# Patient Record
Sex: Male | Born: 1975 | Race: White | Hispanic: Yes | Marital: Single | Smoking: Never smoker
Health system: Southern US, Community
[De-identification: ages and names within clinical notes are randomized; demographics above are authoritative.]

---

## 2019-01-25 ENCOUNTER — Other Ambulatory Visit: Payer: Self-pay

## 2019-01-25 ENCOUNTER — Emergency Department (HOSPITAL_COMMUNITY)
Admission: EM | Admit: 2019-01-25 | Discharge: 2019-01-25 | Disposition: A | Discharge status: Home / Self Care | Payer: BC Managed Care – PPO | Attending: Emergency Medicine | Admitting: Emergency Medicine

## 2019-01-25 ENCOUNTER — Emergency Department (HOSPITAL_COMMUNITY): Payer: BC Managed Care – PPO

## 2019-01-25 ENCOUNTER — Encounter (HOSPITAL_COMMUNITY): Payer: Self-pay | Admitting: Emergency Medicine

## 2019-01-25 DIAGNOSIS — W208XXA Other cause of strike by thrown, projected or falling object, initial encounter: Secondary | ICD-10-CM | POA: Insufficient documentation

## 2019-01-25 DIAGNOSIS — S0012XA Contusion of left eyelid and periocular area, initial encounter: Secondary | ICD-10-CM | POA: Diagnosis not present

## 2019-01-25 DIAGNOSIS — Y929 Unspecified place or not applicable: Secondary | ICD-10-CM | POA: Diagnosis not present

## 2019-01-25 DIAGNOSIS — S0512XA Contusion of eyeball and orbital tissues, left eye, initial encounter: Secondary | ICD-10-CM

## 2019-01-25 DIAGNOSIS — Y93H2 Activity, gardening and landscaping: Secondary | ICD-10-CM | POA: Diagnosis not present

## 2019-01-25 DIAGNOSIS — S098XXA Other specified injuries of head, initial encounter: Secondary | ICD-10-CM | POA: Diagnosis not present

## 2019-01-25 DIAGNOSIS — Y998 Other external cause status: Secondary | ICD-10-CM | POA: Insufficient documentation

## 2019-01-25 DIAGNOSIS — R600 Localized edema: Secondary | ICD-10-CM | POA: Diagnosis present

## 2019-01-25 NOTE — ED Notes (Signed)
OD  20/20 OS 20/30  OU 20/20  L eye bruised and swollen

## 2019-01-25 NOTE — ED Triage Notes (Signed)
Patient states that he was working in the yard and was hit in the face by a limb approximately 3 inches in diameter. Patient states left eye started swelling. States some blurry vision in left eye.  Left eye is swollen shut in triage.

## 2019-01-25 NOTE — ED Provider Notes (Signed)
[facility] EMERGENCY DEPARTMENT Provider Note   CSN: [id number] Arrival date & time: 01/25/19  1552     History   Chief Complaint Chief Complaint  Patient presents with   Facial Swelling    HPI Mitchell Powers is a 43 y.o. male.     HPI   Mitchell Powers is a 43 y.o. male who presents to the Emergency Department requesting evaluation for swelling and pain to his left periorbital area.  He states that he was trimming a tree when a tree branch approximately 3 to 4 cm in diameter struck him along the left eyebrow area.  Incident occurred around 11 AM today.  Initially he reports mild swelling around his left eye that gradually continued throughout the day.  He states he has difficulty opening his left eye due to swelling of his lids, but states that his vision seems "normal."  He denies fall or LOC.  He does endorse a mild headache.  He took ibuprofen prior to arrival which helped his symptoms.  He denies lethargy, vomiting, neck pain, jaw or ear pain, epistaxis.  No open wounds.    History reviewed. No pertinent past medical history.  There are no active problems to display for this patient.   History reviewed. No pertinent surgical history.      Home Medications    Prior to Admission medications   Not on File    Family History No family history on file.  Social History Social History   Tobacco Use   Smoking status: Never Smoker   Smokeless tobacco: Never Used  Substance Use Topics   Alcohol use: Yes    Comment: occ   Drug use: Never     Allergies   Patient has no known allergies.   Review of Systems Review of Systems  Constitutional: Negative for activity change, appetite change and fever.  HENT: Positive for facial swelling (Left periorbital pain and swelling). Negative for dental problem, ear pain, nosebleeds, sore throat and trouble swallowing.   Eyes: Negative for photophobia, pain, discharge, redness and visual disturbance.  Respiratory:  Negative for shortness of breath.   Cardiovascular: Negative for chest pain.  Gastrointestinal: Negative for abdominal pain, nausea and vomiting.  Musculoskeletal: Negative for neck pain and neck stiffness.  Skin: Negative for rash and wound.  Neurological: Positive for headaches. Negative for dizziness, syncope, facial asymmetry, speech difficulty, weakness and numbness.  Psychiatric/Behavioral: Negative for confusion and decreased concentration.     Physical Exam Updated Vital Signs BP 137/72 (BP Location: Right Arm)    Pulse (!) 59    Temp 98.5 F (36.9 C) (Oral)    Resp 16    Ht 5\' 4"  (1.626 m)    Wt 79.4 kg    SpO2 100%    BMI 30.04 kg/m   Physical Exam Vitals signs and nursing note reviewed.  Constitutional:      Appearance: He is not ill-appearing or toxic-appearing.  HENT:     Head: Contusion present. No abrasion.     Jaw: There is normal jaw occlusion. No tenderness, swelling, pain on movement or malocclusion.      Comments: Ecchymosis and edema of the left periorbital region.  Hematoma present along the left eyebrow.  No open wound.  No tenderness of the left orbital floor.    Right Ear: Tympanic membrane and ear canal normal. No mastoid tenderness. No hemotympanum.     Left Ear: Tympanic membrane and ear canal normal. No mastoid tenderness. No hemotympanum.  Nose: Nose normal. No nasal tenderness or rhinorrhea.     Right Nostril: No epistaxis.     Left Nostril: No epistaxis.     Mouth/Throat:     Mouth: Mucous membranes are moist.     Dentition: No dental tenderness.     Pharynx: Oropharynx is clear.  Eyes:     General: Vision grossly intact. Gaze aligned appropriately.     Extraocular Movements: Extraocular movements intact.     Conjunctiva/sclera: Conjunctivae normal.     Left eye: Left conjunctiva is not injected. No hemorrhage.    Pupils: Pupils are equal, round, and reactive to light.     Comments: No FB's seen but exam limited due to unability to evert the  upper lid due to edema.  EOMs intact.  Sclera normal appearing.  Neck:     Musculoskeletal: Normal range of motion. No muscular tenderness.  Cardiovascular:     Rate and Rhythm: Normal rate and regular rhythm.     Pulses: Normal pulses.  Pulmonary:     Effort: Pulmonary effort is normal.     Breath sounds: Normal breath sounds.  Musculoskeletal: Normal range of motion.  Skin:    General: Skin is warm.     Capillary Refill: Capillary refill takes less than 2 seconds.  Neurological:     General: No focal deficit present.     Mental Status: He is alert.     Sensory: Sensation is intact. No sensory deficit.     Motor: Motor function is intact. No weakness.     Comments: CN II-XII intact.  Speech clear.        ED Treatments / Results  Labs (all labs ordered are listed, but only abnormal results are displayed) Labs Reviewed - No data to display  EKG None  Radiology Ct Head Wo Contrast  Result Date: 01/25/2019 CLINICAL DATA:  43 year old male with facial trauma and right eye swelling. EXAM: CT HEAD WITHOUT CONTRAST CT MAXILLOFACIAL WITHOUT CONTRAST CT CERVICAL SPINE WITHOUT CONTRAST TECHNIQUE: Multidetector CT imaging of the head, cervical spine, and maxillofacial structures were performed using the standard protocol without intravenous contrast. Multiplanar CT image reconstructions of the cervical spine and maxillofacial structures were also generated. COMPARISON:  None. FINDINGS: CT HEAD FINDINGS Brain: No evidence of acute infarction, hemorrhage, hydrocephalus, extra-axial collection or mass lesion/mass effect. Vascular: No hyperdense vessel or unexpected calcification. Skull: Normal. Negative for fracture or focal lesion. Other: Left forehead and periorbital hematoma. CT MAXILLOFACIAL FINDINGS Osseous: No fracture or mandibular dislocation. No destructive process. Orbits: The globes and retro-orbital fat are preserved. Sinuses: Clear. Soft tissues: Left facial and periorbital  hematoma. CT CERVICAL SPINE FINDINGS Alignment: No acute subluxation. There is mild reversal of normal cervical lordosis which may be positional or due to muscle spasm. Skull base and vertebrae: No acute fracture. No primary bone lesion or focal pathologic process. Soft tissues and spinal canal: No prevertebral fluid or swelling. No visible canal hematoma. Disc levels:  Mild degenerative changes. Upper chest: Negative. Other: None IMPRESSION: 1. Normal unenhanced CT of the brain. 2. No acute/traumatic cervical spine pathology. 3. No acute/traumatic facial bone fractures. Electronically Signed   By: Anner Crete M.D.   On: 01/25/2019 20:33   Ct Cervical Spine Wo Contrast  Result Date: 01/25/2019 CLINICAL DATA:  43 year old male with facial trauma and right eye swelling. EXAM: CT HEAD WITHOUT CONTRAST CT MAXILLOFACIAL WITHOUT CONTRAST CT CERVICAL SPINE WITHOUT CONTRAST TECHNIQUE: Multidetector CT imaging of the head, cervical spine, and maxillofacial structures were  performed using the standard protocol without intravenous contrast. Multiplanar CT image reconstructions of the cervical spine and maxillofacial structures were also generated. COMPARISON:  None. FINDINGS: CT HEAD FINDINGS Brain: No evidence of acute infarction, hemorrhage, hydrocephalus, extra-axial collection or mass lesion/mass effect. Vascular: No hyperdense vessel or unexpected calcification. Skull: Normal. Negative for fracture or focal lesion. Other: Left forehead and periorbital hematoma. CT MAXILLOFACIAL FINDINGS Osseous: No fracture or mandibular dislocation. No destructive process. Orbits: The globes and retro-orbital fat are preserved. Sinuses: Clear. Soft tissues: Left facial and periorbital hematoma. CT CERVICAL SPINE FINDINGS Alignment: No acute subluxation. There is mild reversal of normal cervical lordosis which may be positional or due to muscle spasm. Skull base and vertebrae: No acute fracture. No primary bone lesion or focal  pathologic process. Soft tissues and spinal canal: No prevertebral fluid or swelling. No visible canal hematoma. Disc levels:  Mild degenerative changes. Upper chest: Negative. Other: None IMPRESSION: 1. Normal unenhanced CT of the brain. 2. No acute/traumatic cervical spine pathology. 3. No acute/traumatic facial bone fractures. Electronically Signed   By: Elgie CollardArash  Radparvar M.D.   On: 01/25/2019 20:33   Ct Maxillofacial Wo Contrast  Result Date: 01/25/2019 CLINICAL DATA:  43 year old male with facial trauma and right eye swelling. EXAM: CT HEAD WITHOUT CONTRAST CT MAXILLOFACIAL WITHOUT CONTRAST CT CERVICAL SPINE WITHOUT CONTRAST TECHNIQUE: Multidetector CT imaging of the head, cervical spine, and maxillofacial structures were performed using the standard protocol without intravenous contrast. Multiplanar CT image reconstructions of the cervical spine and maxillofacial structures were also generated. COMPARISON:  None. FINDINGS: CT HEAD FINDINGS Brain: No evidence of acute infarction, hemorrhage, hydrocephalus, extra-axial collection or mass lesion/mass effect. Vascular: No hyperdense vessel or unexpected calcification. Skull: Normal. Negative for fracture or focal lesion. Other: Left forehead and periorbital hematoma. CT MAXILLOFACIAL FINDINGS Osseous: No fracture or mandibular dislocation. No destructive process. Orbits: The globes and retro-orbital fat are preserved. Sinuses: Clear. Soft tissues: Left facial and periorbital hematoma. CT CERVICAL SPINE FINDINGS Alignment: No acute subluxation. There is mild reversal of normal cervical lordosis which may be positional or due to muscle spasm. Skull base and vertebrae: No acute fracture. No primary bone lesion or focal pathologic process. Soft tissues and spinal canal: No prevertebral fluid or swelling. No visible canal hematoma. Disc levels:  Mild degenerative changes. Upper chest: Negative. Other: None IMPRESSION: 1. Normal unenhanced CT of the brain. 2. No  acute/traumatic cervical spine pathology. 3. No acute/traumatic facial bone fractures. Electronically Signed   By: Elgie CollardArash  Radparvar M.D.   On: 01/25/2019 20:33    Procedures Procedures (including critical care time)  Medications Ordered in ED Medications - No data to display   Initial Impression / Assessment and Plan / ED Course  I have reviewed the triage vital signs and the nursing notes.  Pertinent labs & imaging results that were available during my care of the patient were reviewed by me and considered in my medical decision making (see chart for details).          Visual Acuity  Right Eye Distance:  20/20 Left Eye Distance:  20/30 Bilateral Distance:  20/20  Patient with blunt trauma of the left periorbital region.  No visual deficits on exam.  Left globe intact.  CT scan of the head, C-spine, and maxillofacial region are negative for acute fracture and globe intact.  Patient is well-appearing, no focal neuro deficits.  No history of syncope.  He does have a significant hematoma of the left eyebrow region.  Moderate edema  noted to the left upper eyelid that limits exam of the left eye.  He agrees to continue to apply ice, ibuprofen for pain control and strict return precautions were discussed.  He verbalized understanding and agreed to plan.  He appears appropriate for discharge home.    Final Clinical Impressions(s) / ED Diagnoses   Final diagnoses:  Periorbital contusion of left eye, initial encounter    ED Discharge Orders    None       Pauline Aus, PA-C 01/25/19 2121    Vanetta Mulders, MD 02/04/19 [phone]

## 2019-01-25 NOTE — Discharge Instructions (Signed)
Continue to apply ice packs on and off to your left eye.  You may also use a cool washcloth.  Take ibuprofen 600 to 800 mg with food every every 8 hours as needed for pain.  Return to the emergency room if you develop any worsening symptoms such as sudden onset of severe headache, vomiting, sudden visual changes or confusion

## 2019-01-25 NOTE — ED Notes (Signed)
Hit in head with a branch  Denies LOC  Now with swollen and bruised L eye

## 2019-03-26 ENCOUNTER — Other Ambulatory Visit: Payer: Self-pay

## 2019-03-26 ENCOUNTER — Ambulatory Visit: Payer: BC Managed Care – PPO | Attending: Internal Medicine

## 2019-03-26 DIAGNOSIS — Z20822 Contact with and (suspected) exposure to covid-19: Secondary | ICD-10-CM

## 2019-03-27 ENCOUNTER — Telehealth: Payer: Self-pay

## 2019-03-27 LAB — NOVEL CORONAVIRUS, NAA: SARS-CoV-2, NAA: DETECTED — AB

## 2019-03-27 NOTE — Telephone Encounter (Signed)
Checking on COVID 19 results, not available yet. 

## 2019-09-16 DIAGNOSIS — J019 Acute sinusitis, unspecified: Secondary | ICD-10-CM | POA: Diagnosis not present

## 2019-09-16 DIAGNOSIS — R5382 Chronic fatigue, unspecified: Secondary | ICD-10-CM | POA: Diagnosis not present

## 2019-09-16 DIAGNOSIS — J309 Allergic rhinitis, unspecified: Secondary | ICD-10-CM | POA: Diagnosis not present

## 2019-09-17 DIAGNOSIS — R5383 Other fatigue: Secondary | ICD-10-CM | POA: Diagnosis not present

## 2019-09-17 DIAGNOSIS — I110 Hypertensive heart disease with heart failure: Secondary | ICD-10-CM | POA: Diagnosis not present

## 2019-09-17 DIAGNOSIS — E559 Vitamin D deficiency, unspecified: Secondary | ICD-10-CM | POA: Diagnosis not present

## 2019-09-17 DIAGNOSIS — E7849 Other hyperlipidemia: Secondary | ICD-10-CM | POA: Diagnosis not present

## 2019-09-30 DIAGNOSIS — M545 Low back pain: Secondary | ICD-10-CM | POA: Diagnosis not present

## 2019-09-30 DIAGNOSIS — M2550 Pain in unspecified joint: Secondary | ICD-10-CM | POA: Diagnosis not present

## 2019-10-10 ENCOUNTER — Other Ambulatory Visit (HOSPITAL_COMMUNITY): Payer: Self-pay | Admitting: Family Medicine

## 2019-10-10 ENCOUNTER — Other Ambulatory Visit: Payer: Self-pay

## 2019-10-10 ENCOUNTER — Ambulatory Visit (HOSPITAL_COMMUNITY)
Admission: RE | Admit: 2019-10-10 | Discharge: 2019-10-10 | Disposition: A | Discharge status: Home / Self Care | Payer: BC Managed Care – PPO | Source: Ambulatory Visit | Attending: Family Medicine | Admitting: Family Medicine

## 2019-10-10 DIAGNOSIS — M545 Low back pain, unspecified: Secondary | ICD-10-CM

## 2019-11-08 ENCOUNTER — Ambulatory Visit: Payer: BC Managed Care – PPO

## 2019-12-22 DIAGNOSIS — J309 Allergic rhinitis, unspecified: Secondary | ICD-10-CM | POA: Diagnosis not present

## 2019-12-22 DIAGNOSIS — M545 Low back pain: Secondary | ICD-10-CM | POA: Diagnosis not present

## 2019-12-22 DIAGNOSIS — M2550 Pain in unspecified joint: Secondary | ICD-10-CM | POA: Diagnosis not present

## 2019-12-22 DIAGNOSIS — E7849 Other hyperlipidemia: Secondary | ICD-10-CM | POA: Diagnosis not present

## 2019-12-26 DIAGNOSIS — E7849 Other hyperlipidemia: Secondary | ICD-10-CM | POA: Diagnosis not present

## 2020-02-27 DIAGNOSIS — M2550 Pain in unspecified joint: Secondary | ICD-10-CM | POA: Diagnosis not present

## 2020-02-27 DIAGNOSIS — J309 Allergic rhinitis, unspecified: Secondary | ICD-10-CM | POA: Diagnosis not present

## 2020-02-27 DIAGNOSIS — E7849 Other hyperlipidemia: Secondary | ICD-10-CM | POA: Diagnosis not present

## 2020-08-31 DIAGNOSIS — J309 Allergic rhinitis, unspecified: Secondary | ICD-10-CM | POA: Diagnosis not present

## 2020-08-31 DIAGNOSIS — E7849 Other hyperlipidemia: Secondary | ICD-10-CM | POA: Diagnosis not present

## 2020-08-31 DIAGNOSIS — R5382 Chronic fatigue, unspecified: Secondary | ICD-10-CM | POA: Diagnosis not present

## 2020-09-23 IMAGING — DX DG LUMBAR SPINE COMPLETE 4+V
5 series · 5 of 5 positions shown · non-contrast
Comparison: No prior.

CLINICAL DATA: Low back pain.

EXAM:
LUMBAR SPINE - COMPLETE 4+ VIEW

[l-spine ap]
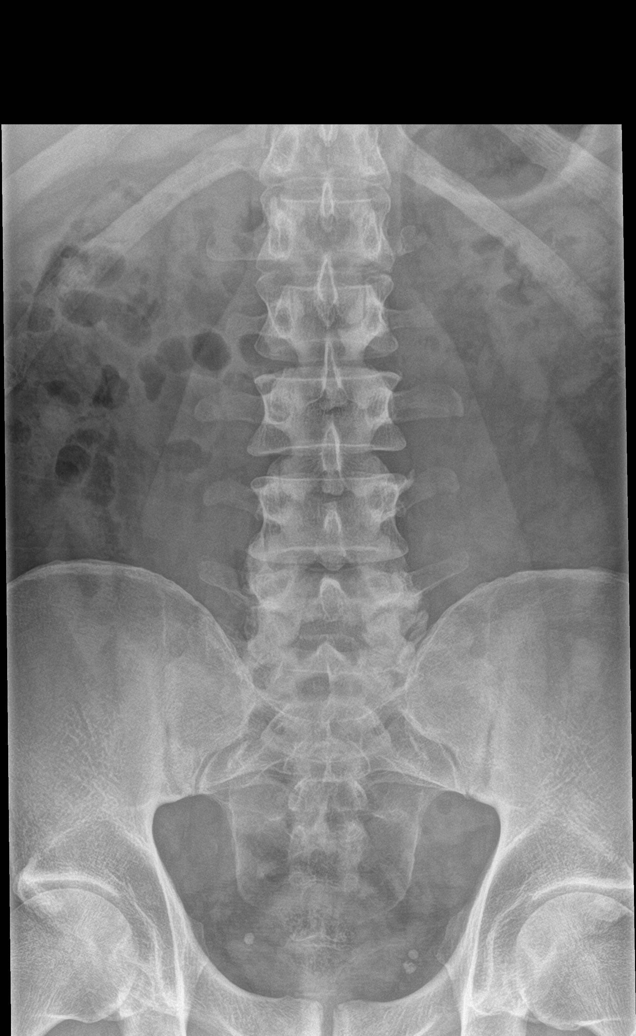

[l-spine obl (1 of 2)]
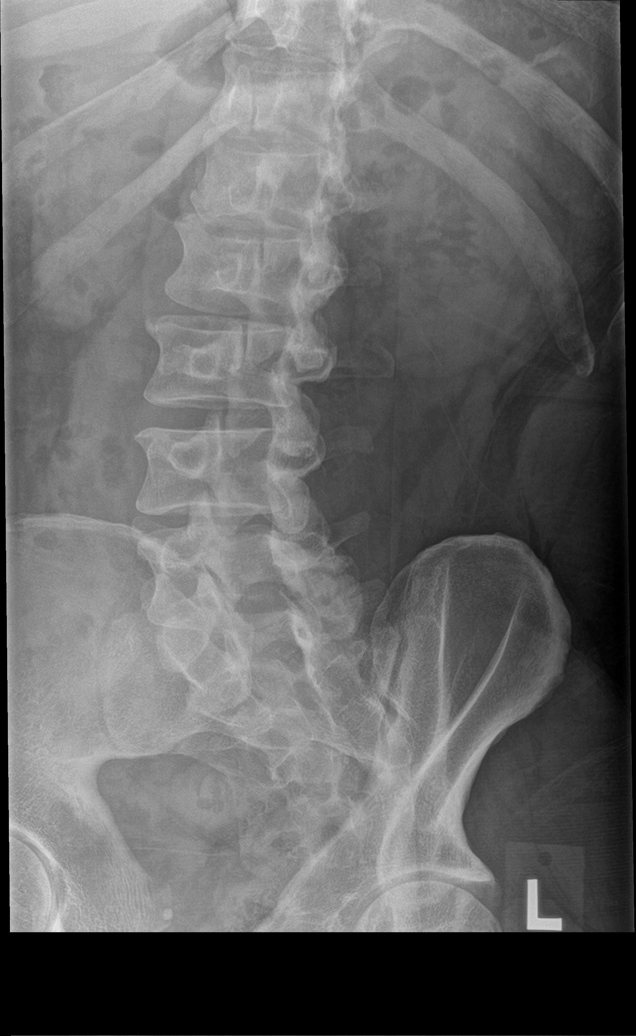

[l-spine obl (2 of 2)]
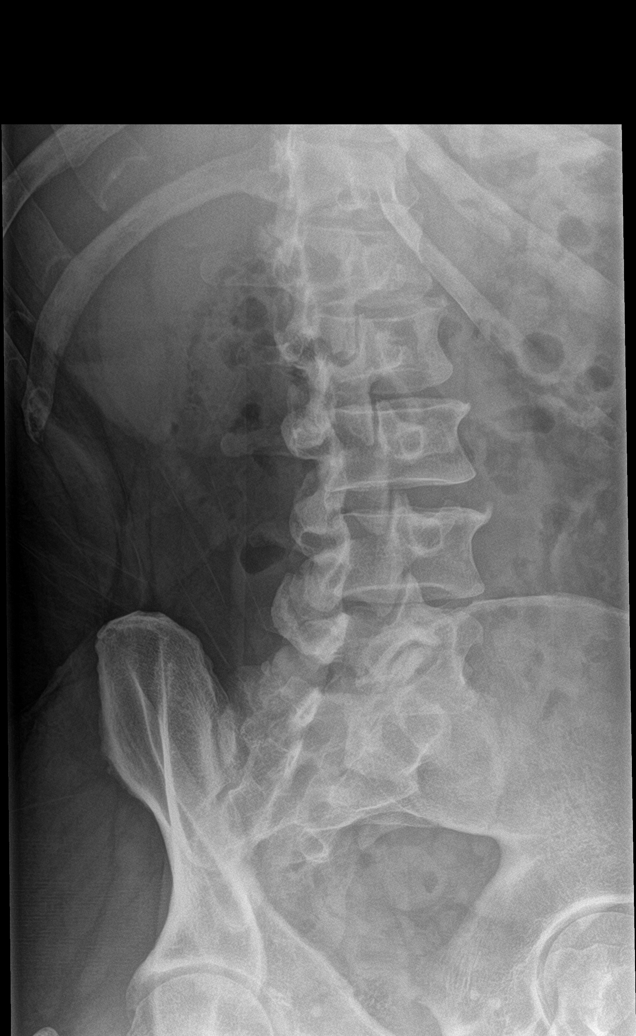

[l-spine lat]
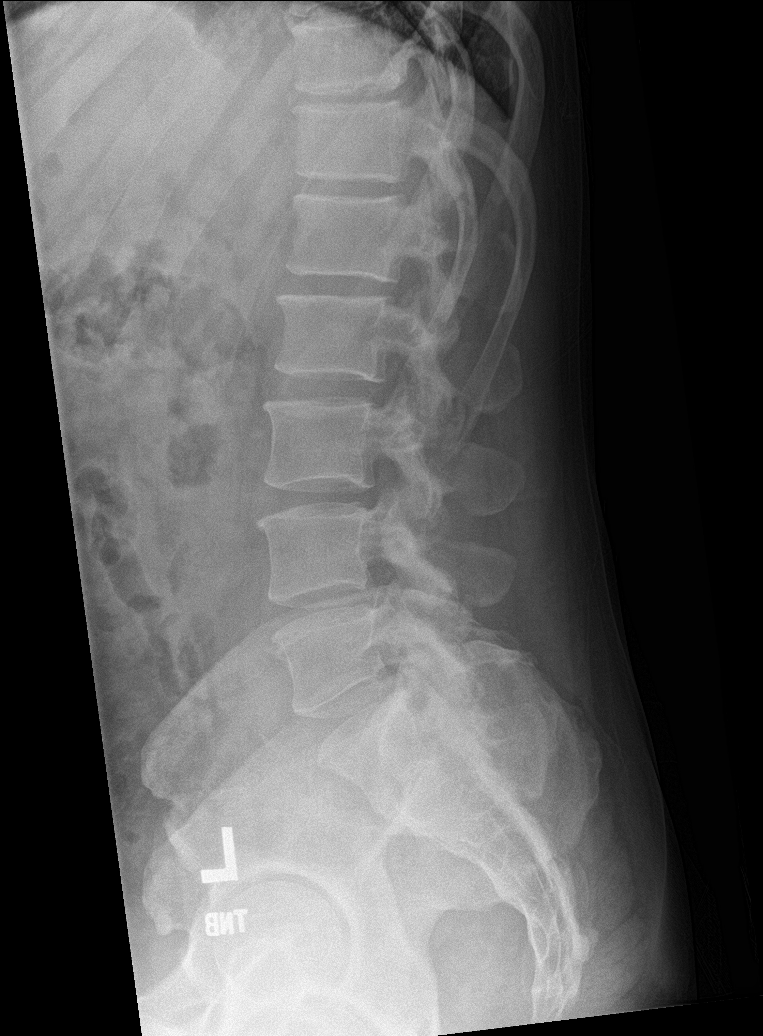

[l-spine spot]
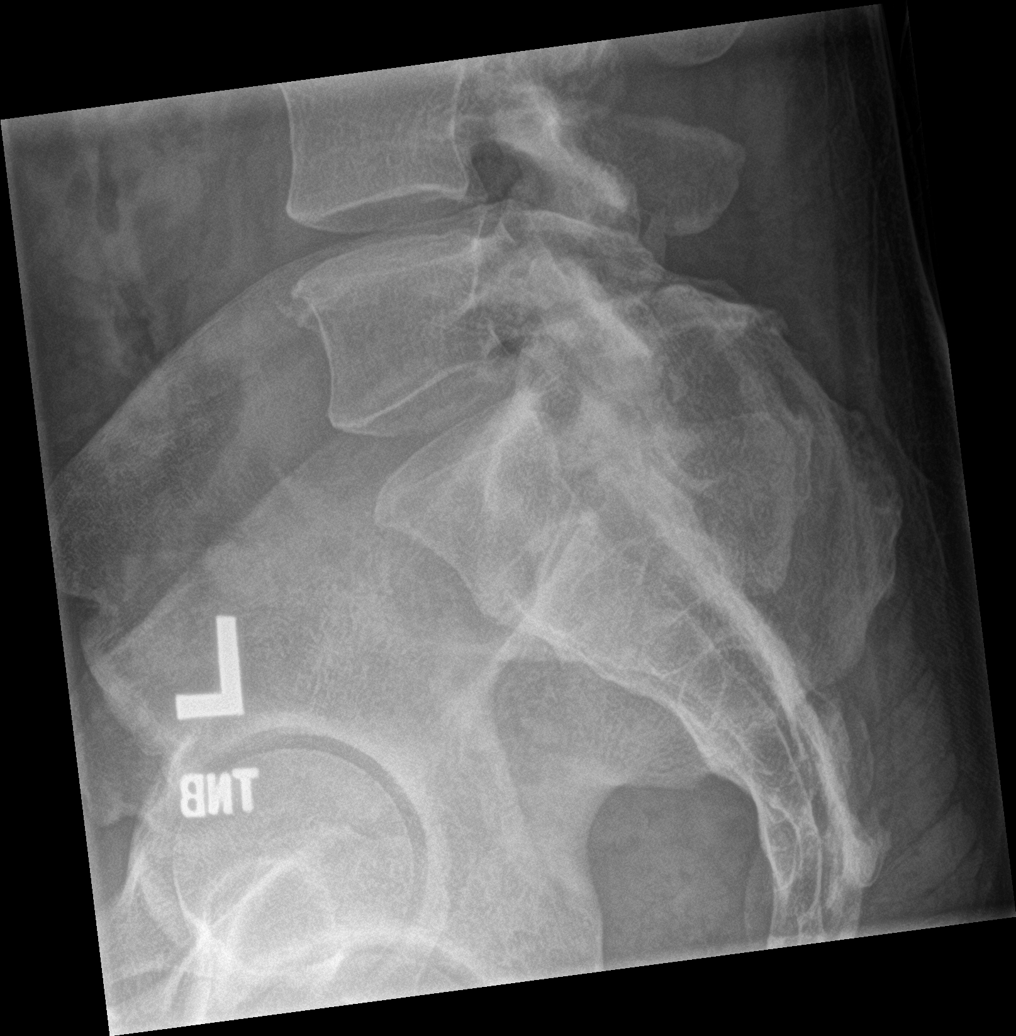

[5 of 5 positions shown; findings below may reference images not displayed]

FINDINGS: Paraspinal soft tissues are unremarkable. Diffuse multilevel
degenerative change. No acute bony abnormality identified. No
evidence of fracture.
IMPRESSION: Diffuse multilevel degenerative change. No acute abnormality
identified.

## 2021-04-26 DIAGNOSIS — L818 Other specified disorders of pigmentation: Secondary | ICD-10-CM | POA: Diagnosis not present

## 2021-04-27 DIAGNOSIS — N4889 Other specified disorders of penis: Secondary | ICD-10-CM | POA: Diagnosis not present

## 2021-04-27 DIAGNOSIS — R309 Painful micturition, unspecified: Secondary | ICD-10-CM | POA: Diagnosis not present

## 2021-04-27 DIAGNOSIS — L818 Other specified disorders of pigmentation: Secondary | ICD-10-CM | POA: Diagnosis not present

## 2021-05-12 DIAGNOSIS — R300 Dysuria: Secondary | ICD-10-CM | POA: Diagnosis not present

## 2021-05-12 DIAGNOSIS — Z113 Encounter for screening for infections with a predominantly sexual mode of transmission: Secondary | ICD-10-CM | POA: Diagnosis not present

## 2021-05-12 DIAGNOSIS — Z7689 Persons encountering health services in other specified circumstances: Secondary | ICD-10-CM | POA: Diagnosis not present

## 2021-05-12 DIAGNOSIS — Z6831 Body mass index (BMI) 31.0-31.9, adult: Secondary | ICD-10-CM | POA: Diagnosis not present

## 2021-05-12 DIAGNOSIS — N489 Disorder of penis, unspecified: Secondary | ICD-10-CM | POA: Diagnosis not present

## 2021-09-20 DIAGNOSIS — Z6830 Body mass index (BMI) 30.0-30.9, adult: Secondary | ICD-10-CM | POA: Diagnosis not present

## 2021-09-20 DIAGNOSIS — K649 Unspecified hemorrhoids: Secondary | ICD-10-CM | POA: Diagnosis not present

## 2021-10-14 DIAGNOSIS — K649 Unspecified hemorrhoids: Secondary | ICD-10-CM | POA: Diagnosis not present

## 2021-10-14 DIAGNOSIS — K625 Hemorrhage of anus and rectum: Secondary | ICD-10-CM | POA: Diagnosis not present

## 2022-01-27 DIAGNOSIS — I10 Essential (primary) hypertension: Secondary | ICD-10-CM | POA: Diagnosis not present

## 2022-01-27 DIAGNOSIS — Z6830 Body mass index (BMI) 30.0-30.9, adult: Secondary | ICD-10-CM | POA: Diagnosis not present

## 2022-01-27 DIAGNOSIS — N489 Disorder of penis, unspecified: Secondary | ICD-10-CM | POA: Diagnosis not present

## 2022-02-10 ENCOUNTER — Ambulatory Visit (INDEPENDENT_AMBULATORY_CARE_PROVIDER_SITE_OTHER): Payer: BC Managed Care – PPO | Admitting: Urology

## 2022-02-10 VITALS — BP 136/83 | HR 62 | Ht 64.0 in | Wt 181.0 lb

## 2022-02-10 DIAGNOSIS — N489 Disorder of penis, unspecified: Secondary | ICD-10-CM

## 2022-02-10 LAB — URINALYSIS, ROUTINE W REFLEX MICROSCOPIC
Bilirubin, UA: NEGATIVE
Glucose, UA: NEGATIVE
Ketones, UA: NEGATIVE
Leukocytes,UA: NEGATIVE
Nitrite, UA: NEGATIVE
Protein,UA: NEGATIVE
RBC, UA: NEGATIVE
Specific Gravity, UA: 1.015 (ref 1.005–1.030)
Urobilinogen, Ur: 0.2 mg/dL (ref 0.2–1.0)
pH, UA: 7 (ref 5.0–7.5)

## 2022-02-10 MED ORDER — CLOTRIMAZOLE-BETAMETHASONE 1-0.05 % EX CREA: 1.0000 | TOPICAL_CREAM | Freq: Two times a day (BID) | CUTANEOUS | 1 refills | Status: DC

## 2022-02-10 NOTE — Patient Instructions (Signed)
Balanitis Balanitis  La balanitis es la hinchazn e irritacin de la cabeza del pene (glande). La balanitis se produce ms frecuentemente entre hombres a los que no se les ha quitado el prepucio (no circuncidados). En los hombres no circuncidados, la afeccin tambin puede causar inflamacin de la piel alrededor del prepucio. La balanitis a veces causa cicatrices en el pene o el prepucio que pueden requerir ciruga. Esta afeccin puede ocurrir debido a una infeccin o a causa de otra afeccin. Si no se trata, la balanitis puede aumentar el riesgo del cncer de pene. Cules son las causas? Las causas ms frecuentes de esta afeccin incluyen las siguientes: Irritacin y falta de circulacin de aire debido al lquido (esmegma) que puede acumularse en el glande. Mala higiene personal, especialmente en los hombres no circuncidados. No limpiar el glande ni el prepucio puede ocasionar la acumulacin de bacterias, virus y hongos, lo que puede provocar infeccin e inflamacin. Otras causas son: Irritacin qumica por productos como jabones o geles de bao, especialmente aquellos que tienen fragancia. La irritacin qumica tambin puede ser causada por condones, lubricantes personales, vaselina, espermicidas y suavizantes y detergentes para la ropa. Enfermedades de la piel, como el eczema, la dermatitis y la psoriasis. Alergias a medicamentos, como tetraciclina y sulfamidas. Qu incrementa el riesgo? Los siguientes factores pueden hacer que sea ms propenso a contraer esta afeccin: No estar circuncidado. Tener diabetes. Tener otras enfermedades, como la cirrosis heptica, insuficiencia cardaca congestiva y enfermedad renal. Tener infecciones tales como candidiasis, VPH (virus del papiloma humano), herpes simple, gonorrea o sfilis. Tener un prepucio apretado que es difcil de tirar hacia atrs (retraer) hasta pasar el glande. Ser extremadamente obeso. Antecedentes de artritis reactiva. Cules son  los signos o sntomas? Los sntomas de esta afeccin incluyen: Secrecin de debajo del prepucio y dolor o dificultad para retraer el prepucio. Mal olor o picazn en el pene. Dolor a la palpacin, enrojecimiento e hinchazn del glande. Erupcin o llagas en el glande o el prepucio. Incapacidad de tener una ereccin a causa del dolor. Dificultad para orinar. Cicatrices en el pene o el prepucio, en algunos casos. Cmo se diagnostica? Esta afeccin se puede diagnosticar mediante un examen fsico y anlisis de un hisopado de secrecin para comprobar si hay infeccin por hongos o bacteriana. Tambin pueden hacerle anlisis de sangre para detectar lo siguiente: Virus que pueden causar balanitis. Un nivel alto de azcar en la sangre (glucosa). Esto podra ser un signo de diabetes, que puede aumentar el riesgo de balanitis. Cmo se trata? El tratamiento de esta afeccin depende de la causa. El tratamiento puede incluir: Mejorar la higiene personal. El mdico podra recomendarle que tome baos con la cadera y las nalgas sumergidas en agua tibia (bao de asiento). Medicamentos tales como los siguientes: Cremas y ungentos para reducir la hinchazn (corticoesteroides) o para tratar una infeccin. Antibiticos. Medicamentos antimicticos. Una ciruga para extirpar o cortar el prepucio (circuncisin). Esto puede hacerse si tiene cicatrices en el prepucio que dificultan tirarlo hacia atrs. Controlar otros problemas mdicos que puedan estar causando su afeccin o empeorndola. Siga estas instrucciones en su casa: Medicamentos Tome los medicamentos de venta libre y los recetados solamente como se lo haya indicado el mdico. Si le recetaron un antibitico, tmelo como se lo haya indicado el mdico. No deje de usar el antibitico aunque comience a sentirse mejor. Instrucciones generales No tenga relaciones sexuales hasta que la afeccin se cure o el mdico lo autorice. Mantenga el pene limpio y seco.  Tome baos de   asiento segn le recomiende el mdico. Evite productos que le irriten la piel o empeoren los sntomas, como jabones y geles de bao con fragancia. Concurra a todas las visitas de seguimiento. Esto es importante. Comunquese con un mdico si: Los sntomas empeoran o no mejoran con los cuidados en el hogar. Tiene escalofros o fiebre. Tiene problemas para orinar. No puede retraer el prepucio. Solicite ayuda de inmediato si: Siente dolor intenso. No puede orinar. Resumen La balanitis es la hinchazn e irritacin de la cabeza del pene (glande). Esta afeccin es ms frecuente en los hombres no circuncidados. La balanitis causa dolor, enrojecimiento e hinchazn del glande. Es importante una buena higiene personal. El tratamiento puede incluir mejorar la higiene personal y aplicarse cremas o ungentos. Comunquese con un mdico si los sntomas empeoran o no mejoran con el cuidado en el hogar. Esta informacin no tiene como fin reemplazar el consejo del mdico. Asegrese de hacerle al mdico cualquier pregunta que tenga. Document Revised: 09/27/2020 Document Reviewed: 09/27/2020 Elsevier Patient Education  2023 Elsevier Inc.  

## 2022-02-10 NOTE — Progress Notes (Signed)
   02/10/2022 9:20 AM   Stephannie Li [birth year] [id number]  Referring provider: Jacelyn Pi, Lilia Argue, MD [address] [place],  [place] [address]  Penile lesion   HPI: [name] is a 46yo here for evaluation of a penile lesion. He has noted discoloration of his foreskin for over 8 months. He has intermittent burning of the skin. He has applied antibiotic ointment in the past. No history of DMII   PMH: No past medical history on file.  Surgical History: No past surgical history on file.  Home Medications:  Allergies as of 02/10/2022   No Known Allergies      Medication List        Accurate as of February 10, 2022  9:20 AM. If you have any questions, ask your nurse or doctor.          hydrocortisone 2.5 % rectal cream Commonly known as: ANUSOL-HC Apply topically 2 (two) times daily.        Allergies: No Known Allergies  Family History: No family history on file.  Social History:  reports that he has never smoked. He has never used smokeless tobacco. He reports current alcohol use. He reports that he does not use drugs.  ROS: All other review of systems were reviewed and are negative except what is noted above in HPI  Physical Exam: BP 136/83   Pulse 62   Ht 5\' 4"  (1.626 m)   Wt 181 lb (82.1 kg)   BMI 31.07 kg/m   Constitutional:  Alert and oriented, No acute distress. HEENT: [place] AT, moist mucus membranes.  Trachea midline, no masses. Cardiovascular: No clubbing, cyanosis, or edema. Respiratory: Normal respiratory effort, no increased work of breathing. GI: Abdomen is soft, nontender, nondistended, no abdominal masses GU: No CVA tenderness. Uncircumcised phallus. Balanitis. No masses/lesions on penis, testis, scrotum.   Lymph: No cervical or inguinal lymphadenopathy. Skin: No rashes, bruises or suspicious lesions. Neurologic: Grossly intact, no focal deficits, moving all 4 extremities. Psychiatric: Normal mood and  affect.  Laboratory Data: No results found for: "WBC", "HGB", "HCT", "MCV", "PLT"  No results found for: "CREATININE"  No results found for: "PSA"  No results found for: "TESTOSTERONE"  No results found for: "HGBA1C"  Urinalysis No results found for: "COLORURINE", "APPEARANCEUR", "LABSPEC", "PHURINE", "GLUCOSEU", "HGBUR", "BILIRUBINUR", "KETONESUR", "PROTEINUR", "UROBILINOGEN", "NITRITE", "LEUKOCYTESUR"  No results found for: "LABMICR", "WBCUA", "RBCUA", "LABEPIT", "MUCUS", "BACTERIA"  Pertinent Imaging:  No results found for this or any previous visit.  No results found for this or any previous visit.  No results found for this or any previous visit.  No results found for this or any previous visit.  No results found for this or any previous visit.  No valid procedures specified. No results found for this or any previous visit.  No results found for this or any previous visit.   Assessment & Plan:    1. Penile lesion/balanitis Clotrimazole BID for 28 days - Urinalysis, Routine w reflex microscopic   No follow-ups on file.  Nicolette Bang, MD  [facility] [place]

## 2022-02-17 DIAGNOSIS — Z6830 Body mass index (BMI) 30.0-30.9, adult: Secondary | ICD-10-CM | POA: Diagnosis not present

## 2022-02-17 DIAGNOSIS — Z23 Encounter for immunization: Secondary | ICD-10-CM | POA: Diagnosis not present

## 2022-02-17 DIAGNOSIS — Z0000 Encounter for general adult medical examination without abnormal findings: Secondary | ICD-10-CM | POA: Diagnosis not present

## 2022-02-17 DIAGNOSIS — Z13220 Encounter for screening for lipoid disorders: Secondary | ICD-10-CM | POA: Diagnosis not present

## 2022-02-17 DIAGNOSIS — Z131 Encounter for screening for diabetes mellitus: Secondary | ICD-10-CM | POA: Diagnosis not present

## 2022-02-19 ENCOUNTER — Encounter: Payer: Self-pay | Admitting: Urology

## 2022-03-20 ENCOUNTER — Ambulatory Visit: Payer: BC Managed Care – PPO | Admitting: Urology

## 2022-03-20 VITALS — BP 138/81 | HR 65

## 2022-03-20 DIAGNOSIS — N489 Disorder of penis, unspecified: Secondary | ICD-10-CM | POA: Diagnosis not present

## 2022-03-20 MED ORDER — NYSTATIN-TRIAMCINOLONE 100000-0.1 UNIT/GM-% EX OINT: 1.0000 | TOPICAL_OINTMENT | Freq: Two times a day (BID) | CUTANEOUS | 2 refills | Status: DC

## 2022-03-20 NOTE — Progress Notes (Signed)
   03/20/2022 3:55 PM   Mitchell Powers 01/01/1976 [id number]  Referring provider: No referring provider defined for this encounter.  Penile lesion   HPI: [name] is a 46yo here for a penile lesion. He was applying clotrimazole BID for 3 weeks and he noted significant improvement in his balanitis and penile pain. He notes some residual irritation on the inferior aspect of his glans.    PMH: No past medical history on file.  Surgical History: No past surgical history on file.  Home Medications:  Allergies as of 03/20/2022   No Known Allergies      Medication List        Accurate as of March 20, 2022  3:55 PM. If you have any questions, ask your nurse or doctor.          clotrimazole-betamethasone cream Commonly known as: Lotrisone Apply 1 Application topically 2 (two) times daily.   hydrocortisone 2.5 % rectal cream Commonly known as: ANUSOL-HC Apply topically 2 (two) times daily.        Allergies: No Known Allergies  Family History: No family history on file.  Social History:  reports that he has never smoked. He has never used smokeless tobacco. He reports current alcohol use. He reports that he does not use drugs.  ROS: All other review of systems were reviewed and are negative except what is noted above in HPI  Physical Exam: BP 138/81   Pulse 65   Constitutional:  Alert and oriented, No acute distress. HEENT: [place] AT, moist mucus membranes.  Trachea midline, no masses. Cardiovascular: No clubbing, cyanosis, or edema. Respiratory: Normal respiratory effort, no increased work of breathing. GI: Abdomen is soft, nontender, nondistended, no abdominal masses GU: No CVA tenderness. Circumcised phallus. No masses/lesions on penis, testis, scrotum. Prostate 40g smooth no nodules no induration.  Lymph: No cervical or inguinal lymphadenopathy. Skin: No rashes, bruises or suspicious lesions. Neurologic: Grossly intact, no focal deficits, moving all 4  extremities. Psychiatric: Normal mood and affect.  Laboratory Data: No results found for: "WBC", "HGB", "HCT", "MCV", "PLT"  No results found for: "CREATININE"  No results found for: "PSA"  No results found for: "TESTOSTERONE"  No results found for: "HGBA1C"  Urinalysis    Component Value Date/Time   APPEARANCEUR Clear 02/10/2022 0850   GLUCOSEU Negative 02/10/2022 0850   BILIRUBINUR Negative 02/10/2022 0850   PROTEINUR Negative 02/10/2022 0850   NITRITE Negative 02/10/2022 0850   LEUKOCYTESUR Negative 02/10/2022 0850    Lab Results  Component Value Date   LABMICR Comment 02/10/2022    Pertinent Imaging:  No results found for this or any previous visit.  No results found for this or any previous visit.  No results found for this or any previous visit.  No results found for this or any previous visit.  No results found for this or any previous visit.  No valid procedures specified. No results found for this or any previous visit.  No results found for this or any previous visit.   Assessment & Plan:    1. Penile lesion Nystatin cream BID for 4 weeks - Urinalysis, Routine w reflex microscopic   No follow-ups on file.  Wilkie Aye, MD  [facility] [place]

## 2022-03-20 NOTE — Patient Instructions (Signed)
Balanitis ? ?Balanitis is swelling and irritation of the head of the penis (glans penis). Balanitis occurs most often among males who have not had their foreskin removed (uncircumcised). In uncircumcised males, the condition may also cause inflammation of the skin around the foreskin. ?Balanitis sometimes causes scarring of the penis or foreskin, which can require surgery. This condition may develop because of an infection or another medical condition. Untreated balanitis can increase the risk of penile cancer. ?What are the causes? ?Common causes of this condition include: ?Irritation and lack of airflow due to fluid (smegma) that can build up on the glans penis. ?Poor personal hygiene, especially in uncircumcised males. Not cleaning the glans penis and foreskin well can result in a buildup of bacteria, viruses, and yeast, which can lead to infection and inflammation. ?Other causes include: ?Chemical irritation from products such as soaps or shower gels, especially those that have fragrance. Chemical irritation can also be caused by condoms, personal lubricants, petroleum jelly, spermicides, fabric softeners, or laundry detergents. ?Skin conditions, such as eczema, dermatitis, and psoriasis. ?Allergies to medicines, such as tetracycline and sulfa drugs. ?What increases the risk? ?The following factors may make you more likely to develop this condition: ?Being an uncircumcised male. ?Having diabetes. ?Having other medical conditions, including liver cirrhosis, congestive heart failure, or kidney disease. ?Having infections, such as candidiasis, HPV (human papillomavirus), herpes simplex, gonorrhea, or syphilis. ?Having a tight foreskin that is difficult to pull back (retract) past the glans penis. ?Being severely obese. ?History of reactive arthritis. ?What are the signs or symptoms? ?Symptoms of this condition include: ?Discharge from under the foreskin, and pain or difficulty retracting the foreskin. ?A bad smell  or itchiness on the penis. ?Tenderness, redness, and swelling of the glans penis. ?A rash or sores on the glans penis or foreskin. ?Inability to get an erection due to pain. ?Trouble urinating. ?Scarring of the penis or foreskin, in some cases. ?How is this diagnosed? ?This condition may be diagnosed based on a physical exam and tests of a swab of discharge to check for bacterial or fungal infection. ?You may also have blood tests to check for: ?Viruses that can cause balanitis. ?A high blood sugar (glucose) level. This could be a sign of diabetes, which can increase the risk of balanitis. ?How is this treated? ?Treatment for this condition depends on the cause. Treatment may include: ?Improving personal hygiene. Your health care provider may recommend sitting in a bath of warm water that is deep enough to cover your hips and buttocks (sitz bath). ?Medicines such as: ?Creams or ointments to reduce swelling (steroids) or to treat an infection. ?Antibiotic medicine. ?Antifungal medicine. ?Having surgery to remove or cut the foreskin (circumcision). This may be done if you have scarring on the foreskin that makes it difficult to retract. ?Controlling other medical problems that may be causing your condition or making it worse. ?Follow these instructions at home: ?Medicines ?Take over-the-counter and prescription medicines only as told by your health care provider. ?If you were prescribed an antibiotic medicine, use it as told by your health care provider. Do not stop using the antibiotic even if you start to feel better. ?General instructions ?Do not have sex until the condition clears up, or until your health care provider approves. ?Keep your penis clean and dry. Take sitz baths as recommended by your health care provider. ?Avoid products that irritate your skin or make symptoms worse, such as soaps and shower gels that have fragrance. ?Keep all follow-up visits. This is   important. ?Contact a health care provider  if: ?Your symptoms get worse or do not improve with home care. ?You develop chills or a fever. ?You have trouble urinating. ?You cannot retract your foreskin. ?Get help right away if: ?You develop severe pain. ?You are unable to urinate. ?Summary ?Balanitis is swelling and irritation of the head of the penis (glans penis). This condition is most common among uncircumcised males. ?Balanitis causes pain, redness, and swelling of the glans penis. ?Good personal hygiene is important. ?Treatment may include improving personal hygiene and applying creams or ointments. ?Contact a health care provider if your symptoms get worse or do not improve with home care. ?This information is not intended to replace advice given to you by your health care provider. Make sure you discuss any questions you have with your health care provider. ?Document Revised: 09/08/2020 Document Reviewed: 09/08/2020 ?Elsevier Patient Education ? 2023 [employer] Inc[employer] ? ?

## 2022-03-28 ENCOUNTER — Encounter: Payer: Self-pay | Admitting: Urology

## 2022-05-05 ENCOUNTER — Encounter: Payer: Self-pay | Admitting: Urology

## 2022-05-05 ENCOUNTER — Ambulatory Visit (INDEPENDENT_AMBULATORY_CARE_PROVIDER_SITE_OTHER): Payer: BC Managed Care – PPO | Admitting: Urology

## 2022-05-05 VITALS — BP 146/85 | HR 64 | Ht 64.0 in | Wt 181.0 lb

## 2022-05-05 DIAGNOSIS — N489 Disorder of penis, unspecified: Secondary | ICD-10-CM

## 2022-05-05 LAB — URINALYSIS, ROUTINE W REFLEX MICROSCOPIC
Bilirubin, UA: NEGATIVE
Glucose, UA: NEGATIVE
Ketones, UA: NEGATIVE
Leukocytes,UA: NEGATIVE
Nitrite, UA: NEGATIVE
Protein,UA: NEGATIVE
RBC, UA: NEGATIVE
Specific Gravity, UA: 1.025 (ref 1.005–1.030)
Urobilinogen, Ur: 0.2 mg/dL (ref 0.2–1.0)
pH, UA: 5 (ref 5.0–7.5)

## 2022-05-05 MED ORDER — FLUCONAZOLE 100 MG PO TABS: 100.0000 mg | ORAL_TABLET | Freq: Every day | ORAL | 0 refills | Status: AC

## 2022-05-05 NOTE — Patient Instructions (Signed)
Balanitis ? ?Balanitis is swelling and irritation of the head of the penis (glans penis). Balanitis occurs most often among males who have not had their foreskin removed (uncircumcised). In uncircumcised males, the condition may also cause inflammation of the skin around the foreskin. ?Balanitis sometimes causes scarring of the penis or foreskin, which can require surgery. This condition may develop because of an infection or another medical condition. Untreated balanitis can increase the risk of penile cancer. ?What are the causes? ?Common causes of this condition include: ?Irritation and lack of airflow due to fluid (smegma) that can build up on the glans penis. ?Poor personal hygiene, especially in uncircumcised males. Not cleaning the glans penis and foreskin well can result in a buildup of bacteria, viruses, and yeast, which can lead to infection and inflammation. ?Other causes include: ?Chemical irritation from products such as soaps or shower gels, especially those that have fragrance. Chemical irritation can also be caused by condoms, personal lubricants, petroleum jelly, spermicides, fabric softeners, or laundry detergents. ?Skin conditions, such as eczema, dermatitis, and psoriasis. ?Allergies to medicines, such as tetracycline and sulfa drugs. ?What increases the risk? ?The following factors may make you more likely to develop this condition: ?Being an uncircumcised male. ?Having diabetes. ?Having other medical conditions, including liver cirrhosis, congestive heart failure, or kidney disease. ?Having infections, such as candidiasis, HPV (human papillomavirus), herpes simplex, gonorrhea, or syphilis. ?Having a tight foreskin that is difficult to pull back (retract) past the glans penis. ?Being severely obese. ?History of reactive arthritis. ?What are the signs or symptoms? ?Symptoms of this condition include: ?Discharge from under the foreskin, and pain or difficulty retracting the foreskin. ?A bad smell  or itchiness on the penis. ?Tenderness, redness, and swelling of the glans penis. ?A rash or sores on the glans penis or foreskin. ?Inability to get an erection due to pain. ?Trouble urinating. ?Scarring of the penis or foreskin, in some cases. ?How is this diagnosed? ?This condition may be diagnosed based on a physical exam and tests of a swab of discharge to check for bacterial or fungal infection. ?You may also have blood tests to check for: ?Viruses that can cause balanitis. ?A high blood sugar (glucose) level. This could be a sign of diabetes, which can increase the risk of balanitis. ?How is this treated? ?Treatment for this condition depends on the cause. Treatment may include: ?Improving personal hygiene. Your health care provider may recommend sitting in a bath of warm water that is deep enough to cover your hips and buttocks (sitz bath). ?Medicines such as: ?Creams or ointments to reduce swelling (steroids) or to treat an infection. ?Antibiotic medicine. ?Antifungal medicine. ?Having surgery to remove or cut the foreskin (circumcision). This may be done if you have scarring on the foreskin that makes it difficult to retract. ?Controlling other medical problems that may be causing your condition or making it worse. ?Follow these instructions at home: ?Medicines ?Take over-the-counter and prescription medicines only as told by your health care provider. ?If you were prescribed an antibiotic medicine, use it as told by your health care provider. Do not stop using the antibiotic even if you start to feel better. ?General instructions ?Do not have sex until the condition clears up, or until your health care provider approves. ?Keep your penis clean and dry. Take sitz baths as recommended by your health care provider. ?Avoid products that irritate your skin or make symptoms worse, such as soaps and shower gels that have fragrance. ?Keep all follow-up visits. This is   important. ?Contact a health care provider  if: ?Your symptoms get worse or do not improve with home care. ?You develop chills or a fever. ?You have trouble urinating. ?You cannot retract your foreskin. ?Get help right away if: ?You develop severe pain. ?You are unable to urinate. ?Summary ?Balanitis is swelling and irritation of the head of the penis (glans penis). This condition is most common among uncircumcised males. ?Balanitis causes pain, redness, and swelling of the glans penis. ?Good personal hygiene is important. ?Treatment may include improving personal hygiene and applying creams or ointments. ?Contact a health care provider if your symptoms get worse or do not improve with home care. ?This information is not intended to replace advice given to you by your health care provider. Make sure you discuss any questions you have with your health care provider. ?Document Revised: 09/08/2020 Document Reviewed: 09/08/2020 ?Elsevier Patient Education ? 2023 [employer] Inc[employer] ? ?

## 2022-05-05 NOTE — Progress Notes (Unsigned)
   05/05/2022 12:56 PM   Stephannie Li 1975-05-03 [id number]  Referring provider: No referring provider defined for this encounter.  No chief complaint on file.   HPI:    PMH: No past medical history on file.  Surgical History: No past surgical history on file.  Home Medications:  Allergies as of 05/05/2022   No Known Allergies      Medication List        Accurate as of May 05, 2022 12:56 PM. If you have any questions, ask your nurse or doctor.          clotrimazole-betamethasone cream Commonly known as: Lotrisone Apply 1 Application topically 2 (two) times daily.   hydrocortisone 2.5 % rectal cream Commonly known as: ANUSOL-HC Apply topically 2 (two) times daily.   nystatin-triamcinolone ointment Commonly known as: MYCOLOG Apply 1 Application topically 2 (two) times daily.        Allergies: No Known Allergies  Family History: No family history on file.  Social History:  reports that he has never smoked. He has never used smokeless tobacco. He reports current alcohol use. He reports that he does not use drugs.  ROS: All other review of systems were reviewed and are negative except what is noted above in HPI  Physical Exam: BP (!) 146/85   Pulse 64   Ht 5\' 4"  (1.626 m)   Wt 181 lb (82.1 kg)   BMI 31.07 kg/m   Constitutional:  Alert and oriented, No acute distress. HEENT: [place] AT, moist mucus membranes.  Trachea midline, no masses. Cardiovascular: No clubbing, cyanosis, or edema. Respiratory: Normal respiratory effort, no increased work of breathing. GI: Abdomen is soft, nontender, nondistended, no abdominal masses GU: No CVA tenderness.  Lymph: No cervical or inguinal lymphadenopathy. Skin: No rashes, bruises or suspicious lesions. Neurologic: Grossly intact, no focal deficits, moving all 4 extremities. Psychiatric: Normal mood and affect.  Laboratory Data: No results found for: "WBC", "HGB", "HCT", "MCV", "PLT"  No results found  for: "CREATININE"  No results found for: "PSA"  No results found for: "TESTOSTERONE"  No results found for: "HGBA1C"  Urinalysis    Component Value Date/Time   APPEARANCEUR Clear 02/10/2022 0850   GLUCOSEU Negative 02/10/2022 0850   BILIRUBINUR Negative 02/10/2022 0850   PROTEINUR Negative 02/10/2022 0850   NITRITE Negative 02/10/2022 0850   LEUKOCYTESUR Negative 02/10/2022 0850    Lab Results  Component Value Date   LABMICR Comment 02/10/2022    Pertinent Imaging: *** No results found for this or any previous visit.  No results found for this or any previous visit.  No results found for this or any previous visit.  No results found for this or any previous visit.  No results found for this or any previous visit.  No valid procedures specified. No results found for this or any previous visit.  No results found for this or any previous visit.   Assessment & Plan:    1. Penile lesion ***   No follow-ups on file.  Nicolette Bang, MD  [facility] [place]

## 2022-07-21 ENCOUNTER — Ambulatory Visit (INDEPENDENT_AMBULATORY_CARE_PROVIDER_SITE_OTHER): Payer: BC Managed Care – PPO | Admitting: Urology

## 2022-07-21 VITALS — BP 123/73 | HR 76

## 2022-07-21 DIAGNOSIS — Z87448 Personal history of other diseases of urinary system: Secondary | ICD-10-CM | POA: Diagnosis not present

## 2022-07-21 DIAGNOSIS — N489 Disorder of penis, unspecified: Secondary | ICD-10-CM

## 2022-07-21 DIAGNOSIS — Z09 Encounter for follow-up examination after completed treatment for conditions other than malignant neoplasm: Secondary | ICD-10-CM

## 2022-07-21 MED ORDER — NYSTATIN-TRIAMCINOLONE 100000-0.1 UNIT/GM-% EX OINT: 1.0000 | TOPICAL_OINTMENT | Freq: Two times a day (BID) | CUTANEOUS | 5 refills | Status: AC

## 2022-07-21 NOTE — Progress Notes (Signed)
   07/21/2022 1:00 PM   Arath Oharrow [birth year] [id number]  Referring provider: No referring provider defined for this encounter.  No chief complaint on file.   HPI:    PMH: No past medical history on file.  Surgical History: No past surgical history on file.  Home Medications:  Allergies as of 07/21/2022   No Known Allergies      Medication List        Accurate as of July 21, 2022  1:00 PM. If you have any questions, ask your nurse or doctor.          clotrimazole-betamethasone cream Commonly known as: Lotrisone Apply 1 Application topically 2 (two) times daily.   fluconazole 100 MG tablet Commonly known as: DIFLUCAN Take 1 tablet (100 mg total) by mouth daily. X 7 days   hydrocortisone 2.5 % rectal cream Commonly known as: ANUSOL-HC Apply topically 2 (two) times daily.   nystatin-triamcinolone ointment Commonly known as: MYCOLOG Apply 1 Application topically 2 (two) times daily.        Allergies: No Known Allergies  Family History: No family history on file.  Social History:  reports that he has never smoked. He has never used smokeless tobacco. He reports current alcohol use. He reports that he does not use drugs.  ROS: All other review of systems were reviewed and are negative except what is noted above in HPI  Physical Exam: BP 123/73   Pulse 76   Constitutional:  Alert and oriented, No acute distress. HEENT: [place] AT, moist mucus membranes.  Trachea midline, no masses. Cardiovascular: No clubbing, cyanosis, or edema. Respiratory: Normal respiratory effort, no increased work of breathing. GI: Abdomen is soft, nontender, nondistended, no abdominal masses GU: No CVA tenderness. Circumcised phallus. No masses/lesions on penis, testis, scrotum. Prostate 40g smooth no nodules no induration.  Lymph: No cervical or inguinal lymphadenopathy. Skin: No rashes, bruises or suspicious lesions. Neurologic: Grossly intact, no focal deficits, moving  all 4 extremities. Psychiatric: Normal mood and affect.  Laboratory Data: No results found for: "WBC", "HGB", "HCT", "MCV", "PLT"  No results found for: "CREATININE"  No results found for: "PSA"  No results found for: "TESTOSTERONE"  No results found for: "HGBA1C"  Urinalysis    Component Value Date/Time   APPEARANCEUR Clear 05/05/2022 1258   GLUCOSEU Negative 05/05/2022 1258   BILIRUBINUR Negative 05/05/2022 1258   PROTEINUR Negative 05/05/2022 1258   NITRITE Negative 05/05/2022 1258   LEUKOCYTESUR Negative 05/05/2022 1258    Lab Results  Component Value Date   LABMICR Comment 05/05/2022    Pertinent Imaging: *** No results found for this or any previous visit.  No results found for this or any previous visit.  No results found for this or any previous visit.  No results found for this or any previous visit.  No results found for this or any previous visit.  No valid procedures specified. No results found for this or any previous visit.  No results found for this or any previous visit.   Assessment & Plan:    1. Penile lesion ***   No follow-ups on file.  Wilkie Aye, MD  [facility] [place]

## 2022-07-25 ENCOUNTER — Encounter: Payer: Self-pay | Admitting: Urology

## 2022-07-25 NOTE — Patient Instructions (Signed)
Balanitis ? ?Balanitis is swelling and irritation of the head of the penis (glans penis). Balanitis occurs most often among males who have not had their foreskin removed (uncircumcised). In uncircumcised males, the condition may also cause inflammation of the skin around the foreskin. ?Balanitis sometimes causes scarring of the penis or foreskin, which can require surgery. This condition may develop because of an infection or another medical condition. Untreated balanitis can increase the risk of penile cancer. ?What are the causes? ?Common causes of this condition include: ?Irritation and lack of airflow due to fluid (smegma) that can build up on the glans penis. ?Poor personal hygiene, especially in uncircumcised males. Not cleaning the glans penis and foreskin well can result in a buildup of bacteria, viruses, and yeast, which can lead to infection and inflammation. ?Other causes include: ?Chemical irritation from products such as soaps or shower gels, especially those that have fragrance. Chemical irritation can also be caused by condoms, personal lubricants, petroleum jelly, spermicides, fabric softeners, or laundry detergents. ?Skin conditions, such as eczema, dermatitis, and psoriasis. ?Allergies to medicines, such as tetracycline and sulfa drugs. ?What increases the risk? ?The following factors may make you more likely to develop this condition: ?Being an uncircumcised male. ?Having diabetes. ?Having other medical conditions, including liver cirrhosis, congestive heart failure, or kidney disease. ?Having infections, such as candidiasis, HPV (human papillomavirus), herpes simplex, gonorrhea, or syphilis. ?Having a tight foreskin that is difficult to pull back (retract) past the glans penis. ?Being severely obese. ?History of reactive arthritis. ?What are the signs or symptoms? ?Symptoms of this condition include: ?Discharge from under the foreskin, and pain or difficulty retracting the foreskin. ?A bad smell  or itchiness on the penis. ?Tenderness, redness, and swelling of the glans penis. ?A rash or sores on the glans penis or foreskin. ?Inability to get an erection due to pain. ?Trouble urinating. ?Scarring of the penis or foreskin, in some cases. ?How is this diagnosed? ?This condition may be diagnosed based on a physical exam and tests of a swab of discharge to check for bacterial or fungal infection. ?You may also have blood tests to check for: ?Viruses that can cause balanitis. ?A high blood sugar (glucose) level. This could be a sign of diabetes, which can increase the risk of balanitis. ?How is this treated? ?Treatment for this condition depends on the cause. Treatment may include: ?Improving personal hygiene. Your health care provider may recommend sitting in a bath of warm water that is deep enough to cover your hips and buttocks (sitz bath). ?Medicines such as: ?Creams or ointments to reduce swelling (steroids) or to treat an infection. ?Antibiotic medicine. ?Antifungal medicine. ?Having surgery to remove or cut the foreskin (circumcision). This may be done if you have scarring on the foreskin that makes it difficult to retract. ?Controlling other medical problems that may be causing your condition or making it worse. ?Follow these instructions at home: ?Medicines ?Take over-the-counter and prescription medicines only as told by your health care provider. ?If you were prescribed an antibiotic medicine, use it as told by your health care provider. Do not stop using the antibiotic even if you start to feel better. ?General instructions ?Do not have sex until the condition clears up, or until your health care provider approves. ?Keep your penis clean and dry. Take sitz baths as recommended by your health care provider. ?Avoid products that irritate your skin or make symptoms worse, such as soaps and shower gels that have fragrance. ?Keep all follow-up visits. This is   important. ?Contact a health care provider  if: ?Your symptoms get worse or do not improve with home care. ?You develop chills or a fever. ?You have trouble urinating. ?You cannot retract your foreskin. ?Get help right away if: ?You develop severe pain. ?You are unable to urinate. ?Summary ?Balanitis is swelling and irritation of the head of the penis (glans penis). This condition is most common among uncircumcised males. ?Balanitis causes pain, redness, and swelling of the glans penis. ?Good personal hygiene is important. ?Treatment may include improving personal hygiene and applying creams or ointments. ?Contact a health care provider if your symptoms get worse or do not improve with home care. ?This information is not intended to replace advice given to you by your health care provider. Make sure you discuss any questions you have with your health care provider. ?Document Revised: 09/08/2020 Document Reviewed: 09/08/2020 ?Elsevier Patient Education ? 2023 [employer] Inc[employer] ? ?

## 2023-07-24 DIAGNOSIS — Z13220 Encounter for screening for lipoid disorders: Secondary | ICD-10-CM | POA: Diagnosis not present

## 2023-07-24 DIAGNOSIS — Z125 Encounter for screening for malignant neoplasm of prostate: Secondary | ICD-10-CM | POA: Diagnosis not present

## 2023-07-24 DIAGNOSIS — Z131 Encounter for screening for diabetes mellitus: Secondary | ICD-10-CM | POA: Diagnosis not present

## 2023-07-24 DIAGNOSIS — Z1331 Encounter for screening for depression: Secondary | ICD-10-CM | POA: Diagnosis not present

## 2023-07-24 DIAGNOSIS — Z0000 Encounter for general adult medical examination without abnormal findings: Secondary | ICD-10-CM | POA: Diagnosis not present

## 2023-07-24 DIAGNOSIS — Z6830 Body mass index (BMI) 30.0-30.9, adult: Secondary | ICD-10-CM | POA: Diagnosis not present

## 2023-07-30 ENCOUNTER — Ambulatory Visit: Payer: BC Managed Care – PPO | Admitting: Urology

## 2023-08-08 ENCOUNTER — Ambulatory Visit: Payer: BC Managed Care – PPO | Admitting: Urology

## 2023-08-08 VITALS — BP 139/83 | HR 63

## 2023-08-08 DIAGNOSIS — N489 Disorder of penis, unspecified: Secondary | ICD-10-CM | POA: Diagnosis not present

## 2023-08-08 LAB — URINALYSIS, ROUTINE W REFLEX MICROSCOPIC
Bilirubin, UA: NEGATIVE
Glucose, UA: NEGATIVE
Ketones, UA: NEGATIVE
Leukocytes,UA: NEGATIVE
Nitrite, UA: NEGATIVE
Protein,UA: NEGATIVE
RBC, UA: NEGATIVE
Specific Gravity, UA: 1.015 (ref 1.005–1.030)
Urobilinogen, Ur: 0.2 mg/dL (ref 0.2–1.0)
pH, UA: 7 (ref 5.0–7.5)

## 2023-08-08 MED ORDER — CLOTRIMAZOLE-BETAMETHASONE 1-0.05 % EX CREA: 1.0000 | TOPICAL_CREAM | Freq: Two times a day (BID) | CUTANEOUS | 3 refills | Status: AC

## 2023-08-08 NOTE — Progress Notes (Signed)
   08/08/2023 10:41 AM   Mitchell Powers [birth year] [id number]  Referring provider: Elyce Hams, Marguerita Shih, MD [address] [place],  [place] [address]  balanitis   HPI: Mitchell Powers is a 47yo here for followup for balanitis. He has not and to apply nystatin  in 1 year. He denies any recent episodes of balanitis. He denies any penile pain. No other complaints today   PMH: No past medical history on file.  Surgical History: No past surgical history on file.  Home Medications:  Allergies as of 08/08/2023   No Known Allergies      Medication List        Accurate as of August 08, 2023 10:41 AM. If you have any questions, ask your nurse or doctor.          clotrimazole -betamethasone  cream Commonly known as: Lotrisone  Apply 1 Application topically 2 (two) times daily.   fluconazole  100 MG tablet Commonly known as: DIFLUCAN  Take 1 tablet (100 mg total) by mouth daily. X 7 days   hydrocortisone 2.5 % rectal cream Commonly known as: ANUSOL-HC Apply topically 2 (two) times daily.   nystatin -triamcinolone  ointment Commonly known as: MYCOLOG Apply 1 Application topically 2 (two) times daily.        Allergies: No Known Allergies  Family History: No family history on file.  Social History:  reports that he has never smoked. He has never used smokeless tobacco. He reports current alcohol use. He reports that he does not use drugs.  ROS: All other review of systems were reviewed and are negative except what is noted above in HPI  Physical Exam: BP 139/83   Pulse 63   Constitutional:  Alert and oriented, No acute distress. HEENT: [place] AT, moist mucus membranes.  Trachea midline, no masses. Cardiovascular: No clubbing, cyanosis, or edema. Respiratory: Normal respiratory effort, no increased work of breathing. GI: Abdomen is soft, nontender, nondistended, no abdominal masses GU: No CVA tenderness.  Lymph: No cervical or inguinal lymphadenopathy. Skin: No  rashes, bruises or suspicious lesions. Neurologic: Grossly intact, no focal deficits, moving all 4 extremities. Psychiatric: Normal mood and affect.  Laboratory Data: No results found for: "WBC", "HGB", "HCT", "MCV", "PLT"  No results found for: "CREATININE"  No results found for: "PSA"  No results found for: "TESTOSTERONE"  No results found for: "HGBA1C"  Urinalysis    Component Value Date/Time   APPEARANCEUR Clear 08/08/2023 1012   GLUCOSEU Negative 08/08/2023 1012   BILIRUBINUR Negative 08/08/2023 1012   PROTEINUR Negative 08/08/2023 1012   NITRITE Negative 08/08/2023 1012   LEUKOCYTESUR Negative 08/08/2023 1012    Lab Results  Component Value Date   LABMICR Comment 08/08/2023    Pertinent Imaging:  No results found for this or any previous visit.  No results found for this or any previous visit.  No results found for this or any previous visit.  No results found for this or any previous visit.  No results found for this or any previous visit.  No results found for this or any previous visit.  No results found for this or any previous visit.  No results found for this or any previous visit.   Assessment & Plan:    1. Penile lesion (Primary) -clotrimazole  prn - Urinalysis, Routine w reflex microscopic   No follow-ups on file.  [name], MD  [facility] [place]

## 2023-08-14 ENCOUNTER — Encounter: Payer: Self-pay | Admitting: Urology

## 2023-08-14 NOTE — Patient Instructions (Signed)
 Balanitis  Balanitis is swelling and irritation of the head of the penis (glans penis). Balanitis occurs most often among males who have not had their foreskin removed (uncircumcised). In uncircumcised males, the condition may also cause inflammation of the skin around the foreskin. Balanitis sometimes causes scarring of the penis or foreskin, which can require surgery. This condition may develop because of an infection or another medical condition. Untreated balanitis can increase the risk of penile cancer. What are the causes? Common causes of this condition include: Irritation and lack of airflow due to fluid (smegma) that can build up on the glans penis. Poor personal hygiene, especially in uncircumcised males. Not cleaning the glans penis and foreskin well can result in a buildup of bacteria, viruses, and yeast, which can lead to infection and inflammation. Other causes include: Chemical irritation from products such as soaps or shower gels, especially those that have fragrance. Chemical irritation can also be caused by condoms, personal lubricants, petroleum jelly, spermicides, fabric softeners, or laundry detergents. Skin conditions, such as eczema, dermatitis, and psoriasis. Allergies to medicines, such as tetracycline and sulfa drugs. What increases the risk? The following factors may make you more likely to develop this condition: Being an uncircumcised male. Having diabetes. Having other medical conditions, including liver cirrhosis, congestive heart failure, or kidney disease. Having infections, such as candidiasis, HPV (human papillomavirus), herpes simplex, gonorrhea, or syphilis. Having a tight foreskin that is difficult to pull back (retract) past the glans penis. Being severely obese. History of reactive arthritis. What are the signs or symptoms? Symptoms of this condition include: Discharge from under the foreskin, and pain or difficulty retracting the foreskin. A bad smell  or itchiness on the penis. Tenderness, redness, and swelling of the glans penis. A rash or sores on the glans penis or foreskin. Inability to get an erection due to pain. Trouble urinating. Scarring of the penis or foreskin, in some cases. How is this diagnosed? This condition may be diagnosed based on a physical exam and tests of a swab of discharge to check for bacterial or fungal infection. You may also have blood tests to check for: Viruses that can cause balanitis. A high blood sugar (glucose) level. This could be a sign of diabetes, which can increase the risk of balanitis. How is this treated? Treatment for this condition depends on the cause. Treatment may include: Improving personal hygiene. Your health care provider may recommend sitting in a bath of warm water that is deep enough to cover your hips and buttocks (sitz bath). Medicines such as: Creams or ointments to reduce swelling (steroids) or to treat an infection. Antibiotic medicine. Antifungal medicine. Having surgery to remove or cut the foreskin (circumcision). This may be done if you have scarring on the foreskin that makes it difficult to retract. Controlling other medical problems that may be causing your condition or making it worse. Follow these instructions at home: Medicines Take over-the-counter and prescription medicines only as told by your health care provider. If you were prescribed an antibiotic medicine, use it as told by your health care provider. Do not stop using the antibiotic even if you start to feel better. General instructions Do not have sex until the condition clears up, or until your health care provider approves. Keep your penis clean and dry. Take sitz baths as recommended by your health care provider. Avoid products that irritate your skin or make symptoms worse, such as soaps and shower gels that have fragrance. Keep all follow-up visits. This is  important. Contact a health care provider  if: Your symptoms get worse or do not improve with home care. You develop chills or a fever. You have trouble urinating. You cannot retract your foreskin. Get help right away if: You develop severe pain. You are unable to urinate. Summary Balanitis is swelling and irritation of the head of the penis (glans penis). This condition is most common among uncircumcised males. Balanitis causes pain, redness, and swelling of the glans penis. Good personal hygiene is important. Treatment may include improving personal hygiene and applying creams or ointments. Contact a health care provider if your symptoms get worse or do not improve with home care. This information is not intended to replace advice given to you by your health care provider. Make sure you discuss any questions you have with your health care provider. Document Revised: 09/08/2020 Document Reviewed: 09/08/2020 Elsevier Patient Education  2024 [employer].

## 2023-08-28 DIAGNOSIS — Z09 Encounter for follow-up examination after completed treatment for conditions other than malignant neoplasm: Secondary | ICD-10-CM | POA: Diagnosis not present

## 2023-08-28 DIAGNOSIS — Z860101 Personal history of adenomatous and serrated colon polyps: Secondary | ICD-10-CM | POA: Diagnosis not present

## 2024-08-08 ENCOUNTER — Ambulatory Visit: Admitting: Urology
